# Patient Record
Sex: Male | Born: 1980 | Race: Black or African American | Hispanic: No | Marital: Single
Health system: Midwestern US, Community
[De-identification: ages and names within clinical notes are randomized; demographics above are authoritative.]

---

## 2019-05-08 ENCOUNTER — Encounter (HOSPITAL_COMMUNITY): Payer: Self-pay

## 2019-05-08 ENCOUNTER — Emergency Department (HOSPITAL_COMMUNITY): Payer: Worker's Compensation

## 2019-05-08 ENCOUNTER — Emergency Department (HOSPITAL_COMMUNITY)
Admission: EM | Admit: 2019-05-08 | Discharge: 2019-05-09 | Disposition: A | Discharge status: Home / Self Care | Payer: Worker's Compensation | Attending: Emergency Medicine | Admitting: Emergency Medicine

## 2019-05-08 ENCOUNTER — Other Ambulatory Visit: Payer: Self-pay

## 2019-05-08 DIAGNOSIS — S8002XA Contusion of left knee, initial encounter: Secondary | ICD-10-CM

## 2019-05-08 DIAGNOSIS — R590 Localized enlarged lymph nodes: Secondary | ICD-10-CM | POA: Diagnosis not present

## 2019-05-08 DIAGNOSIS — Y929 Unspecified place or not applicable: Secondary | ICD-10-CM | POA: Insufficient documentation

## 2019-05-08 DIAGNOSIS — Y939 Activity, unspecified: Secondary | ICD-10-CM | POA: Insufficient documentation

## 2019-05-08 DIAGNOSIS — S39012A Strain of muscle, fascia and tendon of lower back, initial encounter: Secondary | ICD-10-CM

## 2019-05-08 DIAGNOSIS — Y999 Unspecified external cause status: Secondary | ICD-10-CM | POA: Diagnosis not present

## 2019-05-08 DIAGNOSIS — M545 Low back pain: Secondary | ICD-10-CM | POA: Diagnosis present

## 2019-05-08 NOTE — ED Notes (Signed)
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
## 2019-05-09 ENCOUNTER — Other Ambulatory Visit: Payer: Self-pay

## 2019-05-09 ENCOUNTER — Emergency Department (HOSPITAL_COMMUNITY): Payer: Worker's Compensation

## 2019-05-09 LAB — RAPID URINE DRUG SCREEN, HOSP PERFORMED
Amphetamines: NOT DETECTED
Barbiturates: NOT DETECTED
Benzodiazepines: NOT DETECTED
Cocaine: NOT DETECTED
Opiates: NOT DETECTED
Tetrahydrocannabinol: NOT DETECTED

## 2019-05-09 MED ORDER — NAPROXEN 250 MG PO TABS
500.0000 mg | ORAL_TABLET | Freq: Once | ORAL | Status: AC
  Administered 2019-05-09: 500 mg via ORAL
  Filled 2019-05-09: qty 2

## 2019-05-09 MED ORDER — METHOCARBAMOL 500 MG PO TABS
500.0000 mg | ORAL_TABLET | Freq: Once | ORAL | Status: AC
  Administered 2019-05-09: 02:00:00 500 mg via ORAL
  Filled 2019-05-09: qty 1

## 2019-05-09 MED ORDER — OXYCODONE-ACETAMINOPHEN 5-325 MG PO TABS
1.0000 | ORAL_TABLET | Freq: Once | ORAL | Status: AC
  Administered 2019-05-09: 03:00:00 1 via ORAL
  Filled 2019-05-09: qty 1

## 2019-05-09 MED ORDER — TRAMADOL HCL 50 MG PO TABS: 50.0000 mg | ORAL_TABLET | Freq: Three times a day (TID) | ORAL | 0 refills | Status: AC | PRN

## 2019-05-09 MED ORDER — METHOCARBAMOL 500 MG PO TABS: 500.0000 mg | ORAL_TABLET | Freq: Three times a day (TID) | ORAL | 0 refills | Status: AC | PRN

## 2019-05-09 MED ORDER — NAPROXEN 500 MG PO TABS: 500.0000 mg | ORAL_TABLET | Freq: Two times a day (BID) | ORAL | 0 refills | Status: AC

## 2019-05-09 NOTE — ED Notes (Signed)
Taken to CT at this time. 

## 2019-05-09 NOTE — Discharge Instructions (Signed)
Alternate ice and heat to areas of injury 3-4 times per day to limit inflammation and spasm.  Avoid strenuous activity and heavy lifting.  We recommend consistent use of naproxen in addition to Robaxin for muscle spasms.  You have been prescribed tramadol to take as needed for severe pain.  Do not drive or drink alcohol after taking this medication as it may make you drowsy and impair your judgment.    Your CT scan showed enlarged lymph nodes in your abdomen.  This is nonspecific and can be followed by a primary care doctor.  We also recommend follow-up with a primary care doctor to ensure resolution of symptoms/pain.  Return to the ED for any new or concerning symptoms.
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
## 2023-01-26 ENCOUNTER — Inpatient Hospital Stay
Admit: 2023-01-26 | Discharge: 2023-01-26 | Disposition: A | Discharge status: Home / Self Care | Attending: Emergency Medicine

## 2023-01-26 ENCOUNTER — Emergency Department: Admit: 2023-01-26

## 2023-01-26 DIAGNOSIS — M25551 Pain in right hip: Secondary | ICD-10-CM

## 2023-01-26 MED ORDER — KETOROLAC TROMETHAMINE 15 MG/ML IJ SOLN
15 | Freq: Once | INTRAMUSCULAR | Status: DC
Start: 2023-01-26 — End: 2023-01-26

## 2023-01-26 MED ORDER — KETOROLAC TROMETHAMINE 30 MG/ML IJ SOLN
30 | INTRAMUSCULAR | Status: AC
Start: 2023-01-26 — End: 2023-01-26
  Administered 2023-01-26: 14:00:00 30 mg via INTRAMUSCULAR

## 2023-01-26 MED FILL — KETOROLAC TROMETHAMINE 30 MG/ML IJ SOLN: 30 MG/ML | INTRAMUSCULAR | Qty: 1
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
## 2023-01-26 NOTE — ED Notes (Signed)
PT taken to X-ray.     Rhona Leavens, RN  01/26/23 [phone]
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
## 2023-01-26 NOTE — ED Notes (Signed)
10:43 AM  01/26/23     Discharge instructions given to pt (name) with verbalization of understanding. Patient accompanied by calling for ride.  Patient discharged with the following prescriptions NONE. Patient discharged to home with work note (destination).      Marlowe Kays, RN      Marlowe Kays, RN  01/26/23 1044

## 2023-02-22 ENCOUNTER — Inpatient Hospital Stay: Admit: 2023-02-22 | Discharge: 2023-02-22
